# Patient Record
Sex: Male | Born: 2000 | Race: Black or African American | Hispanic: No | Marital: Single | Smoking: Never smoker
Health system: Southern US, Community
[De-identification: ages and names within clinical notes are randomized; demographics above are authoritative.]

---
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
## 2020-01-13 DIAGNOSIS — Z419 Encounter for procedure for purposes other than remedying health state, unspecified: Secondary | ICD-10-CM | POA: Diagnosis not present

## 2020-02-13 DIAGNOSIS — Z419 Encounter for procedure for purposes other than remedying health state, unspecified: Secondary | ICD-10-CM | POA: Diagnosis not present

## 2020-03-15 DIAGNOSIS — Z419 Encounter for procedure for purposes other than remedying health state, unspecified: Secondary | ICD-10-CM | POA: Diagnosis not present

## 2020-04-12 DIAGNOSIS — Z419 Encounter for procedure for purposes other than remedying health state, unspecified: Secondary | ICD-10-CM | POA: Diagnosis not present

## 2020-05-13 DIAGNOSIS — Z419 Encounter for procedure for purposes other than remedying health state, unspecified: Secondary | ICD-10-CM | POA: Diagnosis not present

## 2020-06-12 DIAGNOSIS — Z419 Encounter for procedure for purposes other than remedying health state, unspecified: Secondary | ICD-10-CM | POA: Diagnosis not present

## 2020-07-13 DIAGNOSIS — Z419 Encounter for procedure for purposes other than remedying health state, unspecified: Secondary | ICD-10-CM | POA: Diagnosis not present

## 2020-07-20 ENCOUNTER — Ambulatory Visit (HOSPITAL_COMMUNITY)
Admission: EM | Admit: 2020-07-20 | Discharge: 2020-07-20 | Disposition: A | Discharge status: Home / Self Care | Payer: Medicaid Other | Attending: Family Medicine | Admitting: Family Medicine

## 2020-07-20 ENCOUNTER — Other Ambulatory Visit: Payer: Self-pay

## 2020-07-20 ENCOUNTER — Encounter (HOSPITAL_COMMUNITY): Payer: Self-pay | Admitting: Emergency Medicine

## 2020-07-20 ENCOUNTER — Ambulatory Visit (HOSPITAL_COMMUNITY): Payer: Self-pay

## 2020-07-20 DIAGNOSIS — R369 Urethral discharge, unspecified: Secondary | ICD-10-CM | POA: Diagnosis not present

## 2020-07-20 MED ORDER — LIDOCAINE HCL (PF) 1 % IJ SOLN
INTRAMUSCULAR | Status: AC
  Filled 2020-07-20: qty 2

## 2020-07-20 MED ORDER — CEFTRIAXONE SODIUM 500 MG IJ SOLR
INTRAMUSCULAR | Status: AC
  Filled 2020-07-20: qty 500

## 2020-07-20 MED ORDER — CEFTRIAXONE SODIUM 500 MG IJ SOLR
500.0000 mg | Freq: Once | INTRAMUSCULAR | Status: AC
  Administered 2020-07-20: 500 mg via INTRAMUSCULAR

## 2020-07-20 MED ORDER — DOXYCYCLINE HYCLATE 100 MG PO CAPS: 100.0000 mg | ORAL_CAPSULE | Freq: Two times a day (BID) | ORAL | 0 refills | Status: AC

## 2020-07-20 NOTE — ED Provider Notes (Signed)
  [facility]   [id number] 07/20/20 Arrival Time: 1824  ASSESSMENT & PLAN:  1. Penile discharge      Discharge Instructions     You have been given the following today for treatment of suspected gonorrhea and/or chlamydia:  cefTRIAXone (ROCEPHIN) injection 500 mg  Please pick up your prescription for doxycycline 100 mg and begin taking twice daily for the next seven (7) days.  Even though we have treated you today, we have sent testing for sexually transmitted infections. We will notify you of any positive results once they are received. If required, we will prescribe any medications you might need.  Please refrain from all sexual activity for at least the next seven days.    Pending: Labs Reviewed  CYTOLOGY, (ORAL, ANAL, URETHRAL) ANCILLARY ONLY    Will notify of any positive results. Instructed to refrain from sexual activity for at least seven days.  Reviewed expectations re: course of current medical issues. Questions answered. Outlined signs and symptoms indicating need for more acute intervention. Patient verbalized understanding. After Visit Summary given.   SUBJECTIVE:  Samuel Steele is a 20 y.o. male who presents with complaint of penile discharge. Onset abrupt. First noticed a week ago. Describes discharge as thick and opaque and yellow. No specific aggravating or alleviating factors reported. Denies: urinary frequency, dysuria and gross hematuria. Afebrile. No abdominal or pelvic pain. No n/v. No rashes or lesions. Reports that he is sexually active with single male partner. OTC treatment: none. History of STI: none reported.   OBJECTIVE:  Vitals:   07/20/20 1844  BP: (!) 118/58  Pulse: 66  Resp: 18  Temp: 99.1 F (37.3 C)  TempSrc: Oral  SpO2: 100%     General appearance: alert, cooperative, appears stated age and no distress Throat: lips, mucosa, and tongue normal; teeth and gums normal Lungs: unlabored respirations; speaks full  sentences without difficulty Back: no CVA tenderness; FROM at waist Abdomen: soft, non-tender GU: normal appearing genitalia;; purulent penile discharge Skin: warm and dry Psychological: alert and cooperative; normal mood and affect.  No results found for this or any previous visit.  Labs Reviewed  CYTOLOGY, (ORAL, ANAL, URETHRAL) ANCILLARY ONLY    No Known Allergies  History reviewed. No pertinent past medical history. Family History  Problem Relation Age of Onset  . Heart disease Mother    Social History   Socioeconomic History  . Marital status: Single    Spouse name: Not on file  . Number of children: Not on file  . Years of education: Not on file  . Highest education level: Not on file  Occupational History  . Not on file  Tobacco Use  . Smoking status: Never Smoker  . Smokeless tobacco: Never Used  Vaping Use  . Vaping Use: Some days  Substance and Sexual Activity  . Alcohol use: Yes  . Drug use: Yes    Types: Marijuana  . Sexual activity: Not on file  Other Topics Concern  . Not on file  Social History Narrative  . Not on file   Social Determinants of Health   Financial Resource Strain: Not on file  Food Insecurity: Not on file  Transportation Needs: Not on file  Physical Activity: Not on file  Stress: Not on file  Social Connections: Not on file  Intimate Partner Violence: Not on file          Mardella Layman, MD 07/20/20 [phone]

## 2020-07-20 NOTE — ED Triage Notes (Signed)
penile swelling and discharge for one week

## 2020-07-20 NOTE — Discharge Instructions (Addendum)

## 2020-07-22 LAB — CYTOLOGY, (ORAL, ANAL, URETHRAL) ANCILLARY ONLY
Chlamydia: POSITIVE — AB
Comment: NEGATIVE
Comment: NEGATIVE
Comment: NORMAL
Neisseria Gonorrhea: POSITIVE — AB
Trichomonas: NEGATIVE

## 2020-08-04 ENCOUNTER — Encounter (HOSPITAL_COMMUNITY): Payer: Self-pay | Admitting: *Deleted

## 2020-08-04 ENCOUNTER — Emergency Department (HOSPITAL_COMMUNITY)
Admission: EM | Admit: 2020-08-04 | Discharge: 2020-08-04 | Disposition: A | Discharge status: Home / Self Care | Payer: Medicaid Other | Attending: Emergency Medicine | Admitting: Emergency Medicine

## 2020-08-04 ENCOUNTER — Other Ambulatory Visit: Payer: Self-pay

## 2020-08-04 DIAGNOSIS — Z046 Encounter for general psychiatric examination, requested by authority: Secondary | ICD-10-CM | POA: Insufficient documentation

## 2020-08-04 DIAGNOSIS — Z5321 Procedure and treatment not carried out due to patient leaving prior to being seen by health care provider: Secondary | ICD-10-CM | POA: Insufficient documentation
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
## 2020-08-12 DIAGNOSIS — Z419 Encounter for procedure for purposes other than remedying health state, unspecified: Secondary | ICD-10-CM | POA: Diagnosis not present

## 2020-08-31 ENCOUNTER — Ambulatory Visit (HOSPITAL_COMMUNITY)
Admission: RE | Admit: 2020-08-31 | Discharge: 2020-08-31 | Disposition: A | Discharge status: Home / Self Care | Payer: Medicaid Other | Source: Ambulatory Visit | Attending: Internal Medicine | Admitting: Internal Medicine

## 2020-08-31 ENCOUNTER — Encounter (HOSPITAL_COMMUNITY): Payer: Self-pay

## 2020-08-31 ENCOUNTER — Other Ambulatory Visit: Payer: Self-pay

## 2020-08-31 VITALS — BP 120/67 | HR 71 | Temp 97.7°F | Resp 17

## 2020-08-31 DIAGNOSIS — Z113 Encounter for screening for infections with a predominantly sexual mode of transmission: Secondary | ICD-10-CM | POA: Diagnosis not present

## 2020-08-31 NOTE — Discharge Instructions (Addendum)
Your STD test is pending.  We will if results are positive.

## 2020-08-31 NOTE — ED Triage Notes (Signed)
Patient presents for STD Testing.   Patient was seen " a few weeks ago and got a shot for Gonorrhea".   Patient wants to recheck status.   Patient denies any symptoms.

## 2020-08-31 NOTE — ED Provider Notes (Signed)
[facility]    CSN: [id number] Arrival date & time: 08/31/20  1846      History   Chief Complaint Chief Complaint  Patient presents with   STD Testing    APPT 1900    HPI Samuel Steele is a 20 y.o. male.   Patient presents to urgent care today for retesting for STDs.  Patient tested positive for gonorrhea and chlamydia in June.  Patient took both prescribed antibiotics that were prescribed at previous visit for this as directed.  Denies any current symptoms.  Patient wants to have retest to ensure that infection has resolved.    History reviewed. No pertinent past medical history.  There are no problems to display for this patient.   History reviewed. No pertinent surgical history.     Home Medications    Prior to Admission medications   Medication Sig Start Date End Date Taking? Authorizing Provider  doxycycline (VIBRAMYCIN) 100 MG capsule Take 1 capsule (100 mg total) by mouth 2 (two) times daily. 07/20/20   Mardella Layman, MD    Family History Family History  Problem Relation Age of Onset   Heart disease Mother     Social History Social History   Tobacco Use   Smoking status: Never   Smokeless tobacco: Never  Vaping Use   Vaping Use: Some days  Substance Use Topics   Alcohol use: Yes   Drug use: Yes    Types: Marijuana     Allergies   Patient has no known allergies.   Review of Systems Review of Systems Per HPI  Physical Exam Triage Vital Signs ED Triage Vitals [08/31/20 1927]  Enc Vitals Group     BP 120/67     Pulse Rate 71     Resp 17     Temp 97.7 F (36.5 C)     Temp Source Oral     SpO2 97 %     Weight      Height      Head Circumference      Peak Flow      Pain Score 0     Pain Loc      Pain Edu?      Excl. in GC?    No data found.  Updated Vital Signs BP 120/67 (BP Location: Right Arm)   Pulse 71   Temp 97.7 F (36.5 C) (Oral)   Resp 17   SpO2 97%   Visual Acuity Right Eye Distance:   Left Eye  Distance:   Bilateral Distance:    Right Eye Near:   Left Eye Near:    Bilateral Near:     Physical Exam Constitutional:      Appearance: Normal appearance.  HENT:     Head: Normocephalic and atraumatic.  Eyes:     Extraocular Movements: Extraocular movements intact.     Conjunctiva/sclera: Conjunctivae normal.  Pulmonary:     Effort: Pulmonary effort is normal.  Genitourinary:    Comments: Deferred with shared decision making.  Selfl swab performed. Neurological:     General: No focal deficit present.     Mental Status: He is alert and oriented to person, place, and time. Mental status is at baseline.  Psychiatric:        Mood and Affect: Mood normal.        Behavior: Behavior normal.        Thought Content: Thought content normal.        Judgment: Judgment normal.  UC Treatments / Results  Labs (all labs ordered are listed, but only abnormal results are displayed) Labs Reviewed  CYTOLOGY, (ORAL, ANAL, URETHRAL) ANCILLARY ONLY    EKG   Radiology No results found.  Procedures Procedures (including critical care time)  Medications Ordered in UC Medications - No data to display  Initial Impression / Assessment and Plan / UC Course  I have reviewed the triage vital signs and the nursing notes.  Pertinent labs & imaging results that were available during my care of the patient were reviewed by me and considered in my medical decision making (see chart for details).     STD urethral swab pending.  Will call patient if test results are positive and treat appropriately.  Patient advised to refrain from any sexual activity at least until test results are complete.Discussed strict return precautions. Patient verbalized understanding and is agreeable with plan.  Final Clinical Impressions(s) / UC Diagnoses   Final diagnoses:  Screening examination for venereal disease     Discharge Instructions      Your STD test is pending.  We will if results are  positive.     ED Prescriptions   None    PDMP not reviewed this encounter.   Lance Muss, FNP 08/31/20 2013

## 2020-09-02 LAB — CYTOLOGY, (ORAL, ANAL, URETHRAL) ANCILLARY ONLY
Chlamydia: NEGATIVE
Comment: NEGATIVE
Comment: NEGATIVE
Comment: NORMAL
Neisseria Gonorrhea: POSITIVE — AB
Trichomonas: NEGATIVE

## 2020-09-12 ENCOUNTER — Ambulatory Visit (HOSPITAL_COMMUNITY)
Admission: EM | Admit: 2020-09-12 | Discharge: 2020-09-12 | Disposition: A | Discharge status: Home / Self Care | Payer: Medicaid Other | Attending: Family Medicine | Admitting: Family Medicine

## 2020-09-12 ENCOUNTER — Other Ambulatory Visit: Payer: Self-pay

## 2020-09-12 DIAGNOSIS — Z419 Encounter for procedure for purposes other than remedying health state, unspecified: Secondary | ICD-10-CM | POA: Diagnosis not present

## 2020-09-12 DIAGNOSIS — A549 Gonococcal infection, unspecified: Secondary | ICD-10-CM | POA: Diagnosis not present

## 2020-09-12 MED ORDER — CEFTRIAXONE SODIUM 500 MG IJ SOLR
INTRAMUSCULAR | Status: AC
  Filled 2020-09-12: qty 500

## 2020-09-12 MED ORDER — LIDOCAINE HCL (PF) 1 % IJ SOLN
INTRAMUSCULAR | Status: AC
Start: 2020-09-12 — End: ?
  Filled 2020-09-12: qty 2

## 2020-09-12 MED ORDER — CEFTRIAXONE SODIUM 500 MG IJ SOLR: 500.0000 mg | Freq: Once | INTRAMUSCULAR | Status: DC

## 2020-09-12 MED ORDER — CEFTRIAXONE SODIUM 500 MG IJ SOLR
500.0000 mg | Freq: Once | INTRAMUSCULAR | Status: AC
  Administered 2020-09-12: 500 mg via INTRAMUSCULAR

## 2020-09-12 NOTE — ED Triage Notes (Signed)
Patient presents today for treatment of Gonorrhea. Pt has no complaints at this time.

## 2020-09-12 NOTE — ED Provider Notes (Signed)
  [facility]   [id number] 09/12/20 Arrival Time: 0908  ASSESSMENT & PLAN:  1. Gonorrhea    Given:   Discharge Instructions       Meds ordered this encounter  Medications   cefTRIAXone (ROCEPHIN) injection 500 mg       nstructed to refrain from sexual activity for at least seven days.  Reviewed expectations re: course of current medical issues. Questions answered. Outlined signs and symptoms indicating need for more acute intervention. Patient verbalized understanding. After Visit Summary given.   SUBJECTIVE:  Samuel Steele is a 20 y.o. male who is here for gonorrhea tx. Recent positive. Afebrile. No abdominal or pelvic pain. No n/v. No rashes or lesions.  OBJECTIVE:  General appearance: alert, cooperative, appears stated age and no distress GU: deferred Skin: warm and dry Psychological: alert and cooperative; normal mood and affect.  Results for orders placed or performed during the hospital encounter of 08/31/20  Cytology (oral, anal, urethral) ancillary only  Result Value Ref Range   Neisseria Gonorrhea Positive (A)    Chlamydia Negative    Trichomonas Negative    Comment Normal Reference Ranger Chlamydia - Negative    Comment      Normal Reference Range Neisseria Gonorrhea - Negative   Comment Normal Reference Range Trichomonas - Negative     Labs Reviewed - No data to display  No Known Allergies  No past medical history on file. Family History  Problem Relation Age of Onset   Heart disease Mother    Social History   Socioeconomic History   Marital status: Single    Spouse name: Not on file   Number of children: Not on file   Years of education: Not on file   Highest education level: Not on file  Occupational History   Not on file  Tobacco Use   Smoking status: Never   Smokeless tobacco: Never  Vaping Use   Vaping Use: Some days  Substance and Sexual Activity   Alcohol use: Yes   Drug use: Yes    Types: Marijuana   Sexual  activity: Not on file  Other Topics Concern   Not on file  Social History Narrative   Not on file   Social Determinants of Health   Financial Resource Strain: Not on file  Food Insecurity: Not on file  Transportation Needs: Not on file  Physical Activity: Not on file  Stress: Not on file  Social Connections: Not on file  Intimate Partner Violence: Not on file           Mardella Layman, MD 09/12/20 1002

## 2020-09-12 NOTE — Discharge Instructions (Addendum)
Meds ordered this encounter  Medications   cefTRIAXone (ROCEPHIN) injection 500 mg

## 2020-10-13 DIAGNOSIS — Z419 Encounter for procedure for purposes other than remedying health state, unspecified: Secondary | ICD-10-CM | POA: Diagnosis not present

## 2020-11-12 DIAGNOSIS — Z419 Encounter for procedure for purposes other than remedying health state, unspecified: Secondary | ICD-10-CM | POA: Diagnosis not present

## 2020-12-13 DIAGNOSIS — Z419 Encounter for procedure for purposes other than remedying health state, unspecified: Secondary | ICD-10-CM | POA: Diagnosis not present

## 2021-01-12 DIAGNOSIS — Z419 Encounter for procedure for purposes other than remedying health state, unspecified: Secondary | ICD-10-CM | POA: Diagnosis not present

## 2021-02-12 DIAGNOSIS — Z419 Encounter for procedure for purposes other than remedying health state, unspecified: Secondary | ICD-10-CM | POA: Diagnosis not present

## 2021-03-15 DIAGNOSIS — Z419 Encounter for procedure for purposes other than remedying health state, unspecified: Secondary | ICD-10-CM | POA: Diagnosis not present

## 2021-04-12 DIAGNOSIS — Z419 Encounter for procedure for purposes other than remedying health state, unspecified: Secondary | ICD-10-CM | POA: Diagnosis not present

## 2021-05-13 DIAGNOSIS — Z419 Encounter for procedure for purposes other than remedying health state, unspecified: Secondary | ICD-10-CM | POA: Diagnosis not present

## 2021-06-12 DIAGNOSIS — Z419 Encounter for procedure for purposes other than remedying health state, unspecified: Secondary | ICD-10-CM | POA: Diagnosis not present

## 2021-07-13 DIAGNOSIS — Z419 Encounter for procedure for purposes other than remedying health state, unspecified: Secondary | ICD-10-CM | POA: Diagnosis not present

## 2021-08-12 DIAGNOSIS — Z419 Encounter for procedure for purposes other than remedying health state, unspecified: Secondary | ICD-10-CM | POA: Diagnosis not present

## 2021-09-12 DIAGNOSIS — Z419 Encounter for procedure for purposes other than remedying health state, unspecified: Secondary | ICD-10-CM | POA: Diagnosis not present

## 2021-10-13 DIAGNOSIS — Z419 Encounter for procedure for purposes other than remedying health state, unspecified: Secondary | ICD-10-CM | POA: Diagnosis not present

## 2021-11-12 DIAGNOSIS — Z419 Encounter for procedure for purposes other than remedying health state, unspecified: Secondary | ICD-10-CM | POA: Diagnosis not present

## 2021-12-13 DIAGNOSIS — Z419 Encounter for procedure for purposes other than remedying health state, unspecified: Secondary | ICD-10-CM | POA: Diagnosis not present

## 2022-01-12 DIAGNOSIS — Z419 Encounter for procedure for purposes other than remedying health state, unspecified: Secondary | ICD-10-CM | POA: Diagnosis not present

## 2022-02-12 DIAGNOSIS — Z419 Encounter for procedure for purposes other than remedying health state, unspecified: Secondary | ICD-10-CM | POA: Diagnosis not present

## 2022-03-15 DIAGNOSIS — Z419 Encounter for procedure for purposes other than remedying health state, unspecified: Secondary | ICD-10-CM | POA: Diagnosis not present

## 2022-03-19 ENCOUNTER — Telehealth: Payer: Self-pay

## 2022-03-19 NOTE — Telephone Encounter (Signed)
Mychart msg sent. AS, CMA 

## 2022-04-13 DIAGNOSIS — Z419 Encounter for procedure for purposes other than remedying health state, unspecified: Secondary | ICD-10-CM | POA: Diagnosis not present

## 2022-05-14 DIAGNOSIS — Z419 Encounter for procedure for purposes other than remedying health state, unspecified: Secondary | ICD-10-CM | POA: Diagnosis not present

## 2022-05-31 ENCOUNTER — Ambulatory Visit (INDEPENDENT_AMBULATORY_CARE_PROVIDER_SITE_OTHER): Payer: Medicaid Other | Admitting: Mental Health

## 2022-05-31 ENCOUNTER — Encounter (HOSPITAL_COMMUNITY): Payer: Self-pay | Admitting: Physician Assistant

## 2022-05-31 ENCOUNTER — Ambulatory Visit (INDEPENDENT_AMBULATORY_CARE_PROVIDER_SITE_OTHER): Payer: Medicaid Other | Admitting: Physician Assistant

## 2022-05-31 VITALS — BP 124/74 | HR 68 | Ht 64.0 in | Wt 131.4 lb

## 2022-05-31 DIAGNOSIS — F331 Major depressive disorder, recurrent, moderate: Secondary | ICD-10-CM | POA: Diagnosis not present

## 2022-05-31 DIAGNOSIS — F411 Generalized anxiety disorder: Secondary | ICD-10-CM

## 2022-05-31 DIAGNOSIS — F5105 Insomnia due to other mental disorder: Secondary | ICD-10-CM

## 2022-05-31 DIAGNOSIS — F99 Mental disorder, not otherwise specified: Secondary | ICD-10-CM | POA: Diagnosis not present

## 2022-05-31 DIAGNOSIS — F39 Unspecified mood [affective] disorder: Secondary | ICD-10-CM | POA: Diagnosis not present

## 2022-05-31 MED ORDER — TRAZODONE HCL 50 MG PO TABS: 50.0000 mg | ORAL_TABLET | Freq: Every day | ORAL | 1 refills | Status: DC

## 2022-05-31 MED ORDER — SERTRALINE HCL 50 MG PO TABS: 50.0000 mg | ORAL_TABLET | Freq: Every day | ORAL | 1 refills | Status: DC
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
## 2022-06-13 DIAGNOSIS — Z419 Encounter for procedure for purposes other than remedying health state, unspecified: Secondary | ICD-10-CM | POA: Diagnosis not present

## 2022-06-24 ENCOUNTER — Other Ambulatory Visit (HOSPITAL_COMMUNITY): Payer: Self-pay | Admitting: Physician Assistant

## 2022-06-24 DIAGNOSIS — F331 Major depressive disorder, recurrent, moderate: Secondary | ICD-10-CM

## 2022-06-24 DIAGNOSIS — F411 Generalized anxiety disorder: Secondary | ICD-10-CM

## 2022-06-24 DIAGNOSIS — F5105 Insomnia due to other mental disorder: Secondary | ICD-10-CM

## 2022-07-10 ENCOUNTER — Ambulatory Visit (HOSPITAL_COMMUNITY): Payer: Self-pay | Admitting: Licensed Clinical Social Worker

## 2022-07-12 ENCOUNTER — Ambulatory Visit (INDEPENDENT_AMBULATORY_CARE_PROVIDER_SITE_OTHER): Payer: Medicaid Other | Admitting: Physician Assistant

## 2022-07-12 DIAGNOSIS — F331 Major depressive disorder, recurrent, moderate: Secondary | ICD-10-CM

## 2022-07-12 DIAGNOSIS — F411 Generalized anxiety disorder: Secondary | ICD-10-CM | POA: Diagnosis not present

## 2022-07-12 DIAGNOSIS — F99 Mental disorder, not otherwise specified: Secondary | ICD-10-CM | POA: Diagnosis not present

## 2022-07-12 DIAGNOSIS — F5105 Insomnia due to other mental disorder: Secondary | ICD-10-CM | POA: Diagnosis not present

## 2022-07-12 MED ORDER — SERTRALINE HCL 50 MG PO TABS: ORAL_TABLET | ORAL | 2 refills | Status: DC

## 2022-07-12 MED ORDER — TRAZODONE HCL 50 MG PO TABS: ORAL_TABLET | ORAL | 2 refills | Status: DC
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
## 2022-07-14 DIAGNOSIS — Z419 Encounter for procedure for purposes other than remedying health state, unspecified: Secondary | ICD-10-CM | POA: Diagnosis not present

## 2022-07-15 ENCOUNTER — Encounter (HOSPITAL_COMMUNITY): Payer: Self-pay | Admitting: Physician Assistant

## 2022-08-13 DIAGNOSIS — Z419 Encounter for procedure for purposes other than remedying health state, unspecified: Secondary | ICD-10-CM | POA: Diagnosis not present

## 2022-09-12 ENCOUNTER — Ambulatory Visit (INDEPENDENT_AMBULATORY_CARE_PROVIDER_SITE_OTHER): Payer: Medicaid Other | Admitting: Physician Assistant

## 2022-09-12 ENCOUNTER — Encounter (HOSPITAL_COMMUNITY): Payer: Self-pay | Admitting: Physician Assistant

## 2022-09-12 DIAGNOSIS — F5105 Insomnia due to other mental disorder: Secondary | ICD-10-CM

## 2022-09-12 DIAGNOSIS — F99 Mental disorder, not otherwise specified: Secondary | ICD-10-CM | POA: Diagnosis not present

## 2022-09-12 DIAGNOSIS — F411 Generalized anxiety disorder: Secondary | ICD-10-CM

## 2022-09-12 DIAGNOSIS — F331 Major depressive disorder, recurrent, moderate: Secondary | ICD-10-CM

## 2022-09-12 MED ORDER — SERTRALINE HCL 50 MG PO TABS: ORAL_TABLET | ORAL | 2 refills | Status: AC

## 2022-09-12 MED ORDER — TRAZODONE HCL 50 MG PO TABS: ORAL_TABLET | ORAL | 2 refills | Status: AC
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
## 2022-09-13 DIAGNOSIS — Z419 Encounter for procedure for purposes other than remedying health state, unspecified: Secondary | ICD-10-CM | POA: Diagnosis not present

## 2022-10-14 DIAGNOSIS — Z419 Encounter for procedure for purposes other than remedying health state, unspecified: Secondary | ICD-10-CM | POA: Diagnosis not present

## 2022-11-13 DIAGNOSIS — Z419 Encounter for procedure for purposes other than remedying health state, unspecified: Secondary | ICD-10-CM | POA: Diagnosis not present

## 2022-11-14 ENCOUNTER — Encounter (HOSPITAL_COMMUNITY): Payer: Medicaid Other | Admitting: Physician Assistant

## 2022-12-14 DIAGNOSIS — Z419 Encounter for procedure for purposes other than remedying health state, unspecified: Secondary | ICD-10-CM | POA: Diagnosis not present

## 2023-01-13 DIAGNOSIS — Z419 Encounter for procedure for purposes other than remedying health state, unspecified: Secondary | ICD-10-CM | POA: Diagnosis not present

## 2023-02-13 DIAGNOSIS — Z419 Encounter for procedure for purposes other than remedying health state, unspecified: Secondary | ICD-10-CM | POA: Diagnosis not present

## 2023-03-16 DIAGNOSIS — Z419 Encounter for procedure for purposes other than remedying health state, unspecified: Secondary | ICD-10-CM | POA: Diagnosis not present

## 2023-04-13 DIAGNOSIS — Z419 Encounter for procedure for purposes other than remedying health state, unspecified: Secondary | ICD-10-CM | POA: Diagnosis not present

## 2023-05-25 DIAGNOSIS — Z419 Encounter for procedure for purposes other than remedying health state, unspecified: Secondary | ICD-10-CM | POA: Diagnosis not present

## 2023-06-24 DIAGNOSIS — Z419 Encounter for procedure for purposes other than remedying health state, unspecified: Secondary | ICD-10-CM | POA: Diagnosis not present

## 2023-07-25 DIAGNOSIS — Z419 Encounter for procedure for purposes other than remedying health state, unspecified: Secondary | ICD-10-CM | POA: Diagnosis not present

## 2023-08-24 DIAGNOSIS — Z419 Encounter for procedure for purposes other than remedying health state, unspecified: Secondary | ICD-10-CM | POA: Diagnosis not present

## 2023-09-24 DIAGNOSIS — Z419 Encounter for procedure for purposes other than remedying health state, unspecified: Secondary | ICD-10-CM | POA: Diagnosis not present

## 2023-10-25 DIAGNOSIS — Z419 Encounter for procedure for purposes other than remedying health state, unspecified: Secondary | ICD-10-CM | POA: Diagnosis not present
